# Patient Record
Sex: Male | Born: 1970 | Race: White | Hispanic: No | Marital: Married | Smoking: Never smoker
Health system: Southern US, Community
[De-identification: ages and names within clinical notes are randomized; demographics above are authoritative.]

## PROBLEM LIST (undated history)

## (undated) DIAGNOSIS — I10 Essential (primary) hypertension: Secondary | ICD-10-CM

## (undated) DIAGNOSIS — F329 Major depressive disorder, single episode, unspecified: Secondary | ICD-10-CM

## (undated) DIAGNOSIS — F32A Depression, unspecified: Secondary | ICD-10-CM

## (undated) DIAGNOSIS — F419 Anxiety disorder, unspecified: Secondary | ICD-10-CM

## (undated) HISTORY — PX: TONSILLECTOMY: SUR1361

## (undated) HISTORY — PX: EYE SURGERY: SHX253

---

## 2004-08-25 ENCOUNTER — Ambulatory Visit (HOSPITAL_COMMUNITY): Admission: RE | Admit: 2004-08-25 | Discharge: 2004-08-25 | Payer: Self-pay | Admitting: Family Medicine

## 2012-11-01 ENCOUNTER — Emergency Department (HOSPITAL_COMMUNITY)
Admission: EM | Admit: 2012-11-01 | Discharge: 2012-11-01 | Disposition: A | Discharge status: Home / Self Care | Payer: Managed Care, Other (non HMO) | Source: Home / Self Care | Attending: Family Medicine | Admitting: Family Medicine

## 2012-11-01 ENCOUNTER — Encounter (HOSPITAL_COMMUNITY): Payer: Self-pay | Admitting: Emergency Medicine

## 2012-11-01 DIAGNOSIS — J069 Acute upper respiratory infection, unspecified: Secondary | ICD-10-CM

## 2012-11-01 HISTORY — DX: Major depressive disorder, single episode, unspecified: F32.9

## 2012-11-01 HISTORY — DX: Essential (primary) hypertension: I10

## 2012-11-01 HISTORY — DX: Depression, unspecified: F32.A

## 2012-11-01 HISTORY — DX: Anxiety disorder, unspecified: F41.9

## 2012-11-01 MED ORDER — IPRATROPIUM BROMIDE 0.06 % NA SOLN: 2.0000 | Freq: Four times a day (QID) | NASAL | Status: AC

## 2012-11-01 MED ORDER — AZITHROMYCIN 250 MG PO TABS: ORAL_TABLET | ORAL | Status: AC

## 2012-11-01 NOTE — ED Notes (Signed)
Pt c/o sinus congestion and pressure. Dry nonproductive cough. Low grade temp.   Pt states unable to sleep at night due cough.   Denies sob, n/v/d. Pt has taken otc meds with no relief of symptoms.

## 2012-11-01 NOTE — ED Notes (Signed)
Waiting discharge papers 

## 2012-11-01 NOTE — ED Provider Notes (Signed)
History     CSN: [id number]  Arrival date & time 11/01/12  1136   First MD Initiated Contact with Patient 11/01/12 1138      Chief Complaint  Patient presents with  . Facial Pain    sinus congestion and pressure. dry cough. low grade temp. cant sleep at night due to cough.     (Consider location/radiation/quality/duration/timing/severity/associated sxs/prior treatment) Patient is a 42 y.o. male presenting with URI. The history is provided by the patient.  URI The primary symptoms include cough. Primary symptoms do not include fever, headaches, sore throat, wheezing or myalgias. The current episode started more than 1 week ago. This is a new problem. The problem has not changed since onset. Symptoms associated with the illness include sinus pressure, congestion and rhinorrhea. The illness is not associated with chills.    Past Medical History  Diagnosis Date  . Hypertension   . Anxiety   . Depression     Past Surgical History  Procedure Date  . Tonsillectomy   . Eye surgery     History reviewed. No pertinent family history.  History  Substance Use Topics  . Smoking status: Never Smoker   . Smokeless tobacco: Not on file  . Alcohol Use: Yes     Comment: occasional      Review of Systems  Constitutional: Negative.  Negative for fever and chills.  HENT: Positive for congestion, rhinorrhea, postnasal drip and sinus pressure. Negative for sore throat.   Respiratory: Positive for cough. Negative for wheezing.   Musculoskeletal: Negative for myalgias.  Neurological: Negative for headaches.    Allergies  Codeine  Home Medications   Current Outpatient Rx  Name  Route  Sig  Dispense  Refill  . AZITHROMYCIN 250 MG PO TABS      Take as directed on pack   6 each   0   . IPRATROPIUM BROMIDE 0.06 % NA SOLN   Nasal   Place 2 sprays into the nose 4 (four) times daily.   15 mL   1     BP 120/91  Pulse 86  Temp 98.9 F (37.2 C) (Oral)  Resp 18  SpO2  100%  Physical Exam  Nursing note and vitals reviewed. Constitutional: He is oriented to person, place, and time. He appears well-developed and well-nourished.  HENT:  Head: Normocephalic.  Right Ear: External ear normal.  Left Ear: External ear normal.  Nose: Mucosal edema and rhinorrhea present. No epistaxis. Right sinus exhibits maxillary sinus tenderness and frontal sinus tenderness. Left sinus exhibits maxillary sinus tenderness and frontal sinus tenderness.  Mouth/Throat: Oropharynx is clear and moist.  Cardiovascular: Regular rhythm and normal heart sounds.   Pulmonary/Chest: Effort normal and breath sounds normal.  Neurological: He is alert and oriented to person, place, and time.  Skin: Skin is warm and dry.    ED Course  Procedures (including critical care time)  Labs Reviewed - No data to display No results found.   1. URI (upper respiratory infection)       MDM          Linna Hoff, MD 11/01/12 1223

## 2014-05-26 ENCOUNTER — Encounter (HOSPITAL_COMMUNITY): Payer: Self-pay | Admitting: Emergency Medicine

## 2014-05-26 ENCOUNTER — Emergency Department (HOSPITAL_COMMUNITY)
Admission: EM | Admit: 2014-05-26 | Discharge: 2014-05-26 | Disposition: A | Discharge status: Home / Self Care | Payer: BC Managed Care – PPO | Attending: Emergency Medicine | Admitting: Emergency Medicine

## 2014-05-26 ENCOUNTER — Emergency Department (HOSPITAL_COMMUNITY): Payer: BC Managed Care – PPO

## 2014-05-26 DIAGNOSIS — I10 Essential (primary) hypertension: Secondary | ICD-10-CM | POA: Diagnosis not present

## 2014-05-26 DIAGNOSIS — IMO0002 Reserved for concepts with insufficient information to code with codable children: Secondary | ICD-10-CM | POA: Diagnosis not present

## 2014-05-26 DIAGNOSIS — M549 Dorsalgia, unspecified: Secondary | ICD-10-CM | POA: Diagnosis not present

## 2014-05-26 DIAGNOSIS — S20219A Contusion of unspecified front wall of thorax, initial encounter: Secondary | ICD-10-CM | POA: Diagnosis not present

## 2014-05-26 DIAGNOSIS — Y9389 Activity, other specified: Secondary | ICD-10-CM | POA: Diagnosis not present

## 2014-05-26 DIAGNOSIS — S298XXA Other specified injuries of thorax, initial encounter: Secondary | ICD-10-CM | POA: Diagnosis present

## 2014-05-26 DIAGNOSIS — Z79899 Other long term (current) drug therapy: Secondary | ICD-10-CM | POA: Insufficient documentation

## 2014-05-26 DIAGNOSIS — Z8659 Personal history of other mental and behavioral disorders: Secondary | ICD-10-CM | POA: Diagnosis not present

## 2014-05-26 DIAGNOSIS — Y9289 Other specified places as the place of occurrence of the external cause: Secondary | ICD-10-CM | POA: Insufficient documentation

## 2014-05-26 DIAGNOSIS — S20212A Contusion of left front wall of thorax, initial encounter: Secondary | ICD-10-CM

## 2014-05-26 MED ORDER — IBUPROFEN 400 MG PO TABS
600.0000 mg | ORAL_TABLET | Freq: Once | ORAL | Status: AC
  Administered 2014-05-26: 600 mg via ORAL
  Filled 2014-05-26: qty 2

## 2014-05-26 MED ORDER — IBUPROFEN 600 MG PO TABS: 600.0000 mg | ORAL_TABLET | Freq: Four times a day (QID) | ORAL | Status: AC | PRN

## 2014-05-26 NOTE — ED Notes (Signed)
Left Rib pain since last Thursday after hearing a pop. States he just wants to make sure everything is okay. Pt states sometimes hurts to take a deep breath but that has gotten better.

## 2014-05-26 NOTE — Discharge Instructions (Signed)

## 2014-05-26 NOTE — ED Provider Notes (Signed)
CSN: [id number]     Arrival date & time 05/26/14  0813 History   First MD Initiated Contact with Patient 05/26/14 [phone]     Chief Complaint  Patient presents with  . Chest Pain     (Consider location/radiation/quality/duration/timing/severity/associated sxs/prior Treatment) HPI  This is a 43 year old male with a history of hypertension, anxiety, and depression who presents with left rib pain. Patient states that on Thursday last week he was trying to unclog a grease gun. He states that it recoiled and hit him in the left chest. Since that time he has had sharp chest pains that come and go. It is worse with some movements and breathing. He sometimes feels a "pop." He has not taken anything for pain. Currently his pain is 4/10. He states that sometimes he can also fill in his back. He denies any recent illnesses, cough, fevers.  Past Medical History  Diagnosis Date  . Hypertension   . Anxiety   . Depression    Past Surgical History  Procedure Laterality Date  . Tonsillectomy    . Eye surgery     No family history on file. History  Substance Use Topics  . Smoking status: Never Smoker   . Smokeless tobacco: Not on file  . Alcohol Use: Yes     Comment: occasional    Review of Systems  Constitutional: Negative.  Negative for fever.  Respiratory: Negative.  Negative for cough, chest tightness and shortness of breath.   Cardiovascular: Positive for chest pain.  Gastrointestinal: Negative.   Genitourinary: Negative.  Negative for dysuria.  Musculoskeletal: Positive for back pain.  Skin: Negative for rash.  Neurological: Negative for headaches.  All other systems reviewed and are negative.     Allergies  Benadryl and Codeine  Home Medications   Prior to Admission medications   Medication Sig Start Date End Date Taking? Authorizing Provider  azithromycin (ZITHROMAX Z-PAK) 250 MG tablet Take as directed on pack 11/01/12   Linna Hoff, MD  ipratropium (ATROVENT) 0.06 %  nasal spray Place 2 sprays into the nose 4 (four) times daily. 11/01/12   Linna Hoff, MD   BP 126/92  Pulse 80  Temp(Src) 98.5 F (36.9 C) (Oral)  Resp 16  Ht 5\' 10"  (1.778 m)  Wt 192 lb (87.091 kg)  BMI 27.55 kg/m2  SpO2 100% Physical Exam  Nursing note and vitals reviewed. Constitutional: He is oriented to person, place, and time. He appears well-developed and well-nourished. No distress.  HENT:  Head: Normocephalic and atraumatic.  Cardiovascular: Normal rate, regular rhythm and normal heart sounds.   No murmur heard. Pulmonary/Chest: Effort normal and breath sounds normal. No respiratory distress. He has no wheezes. He exhibits tenderness.  Tenderness palpation of the anterior left chest wall without crepitus  Abdominal: Soft. Bowel sounds are normal. There is no tenderness. There is no rebound.  Musculoskeletal: He exhibits no edema.  Neurological: He is alert and oriented to person, place, and time.  Skin: Skin is warm and dry.  Psychiatric: He has a normal mood and affect.    ED Course  Procedures (including critical care time) Labs Review Labs Reviewed - No data to display  Imaging Review Dg Ribs Unilateral W/chest Left  05/26/2014   CLINICAL DATA:  Left chest pain.  EXAM: LEFT RIBS AND CHEST - 3+ VIEW  COMPARISON:  None.  FINDINGS: No fracture or other bone lesions are seen involving the ribs. There is no evidence of pneumothorax or pleural effusion. Both  lungs are clear. Heart size and mediastinal contours are within normal limits.  IMPRESSION: Negative.   Electronically Signed   By: Marlan Palauharles  Clark M.D.   On: 05/26/2014 08:51     EKG Interpretation None      MDM   Final diagnoses:  Chest wall contusion, left, initial encounter    Patient presents with left-sided rib pain. He is nontoxic on exam. Sustained injury approximately 6 days ago. He has tenderness to palpation that is reproducible. He is in no acute distress.  Patient was given ibuprofen. Chest and  rib films show no evidence of rib fracture. Patient states that he does not like to take pain medication. Will discharge with ibuprofen. Discussed with patient continue deep breathing to prevent pneumonia.  After history, exam, and medical workup I feel the patient has been appropriately medically screened and is safe for discharge home. Pertinent diagnoses were discussed with the patient. Patient was given return precautions.     Shon Batonourtney F Saron Tweed, MD 05/26/14 [phone]

## 2014-12-20 IMAGING — CR DG RIBS W/ CHEST 3+V*L*
4 series · 4 of 4 positions shown · non-contrast
Comparison: None.

CLINICAL DATA: Left chest pain.

EXAM:
LEFT RIBS AND CHEST - 3+ VIEW

[view not recorded (1 of 4)]
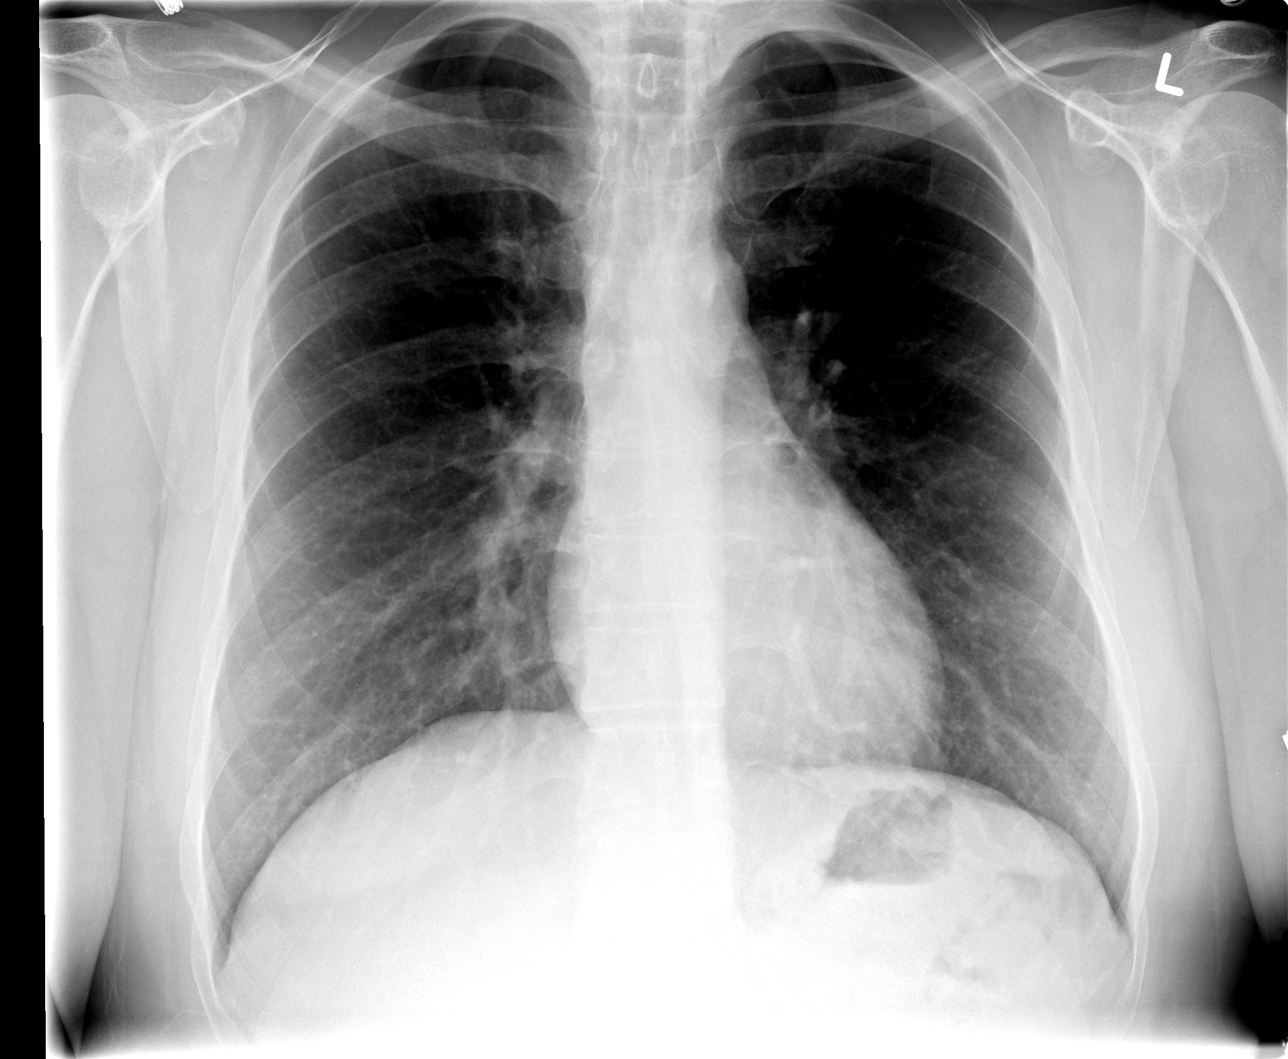

[view not recorded (2 of 4)]
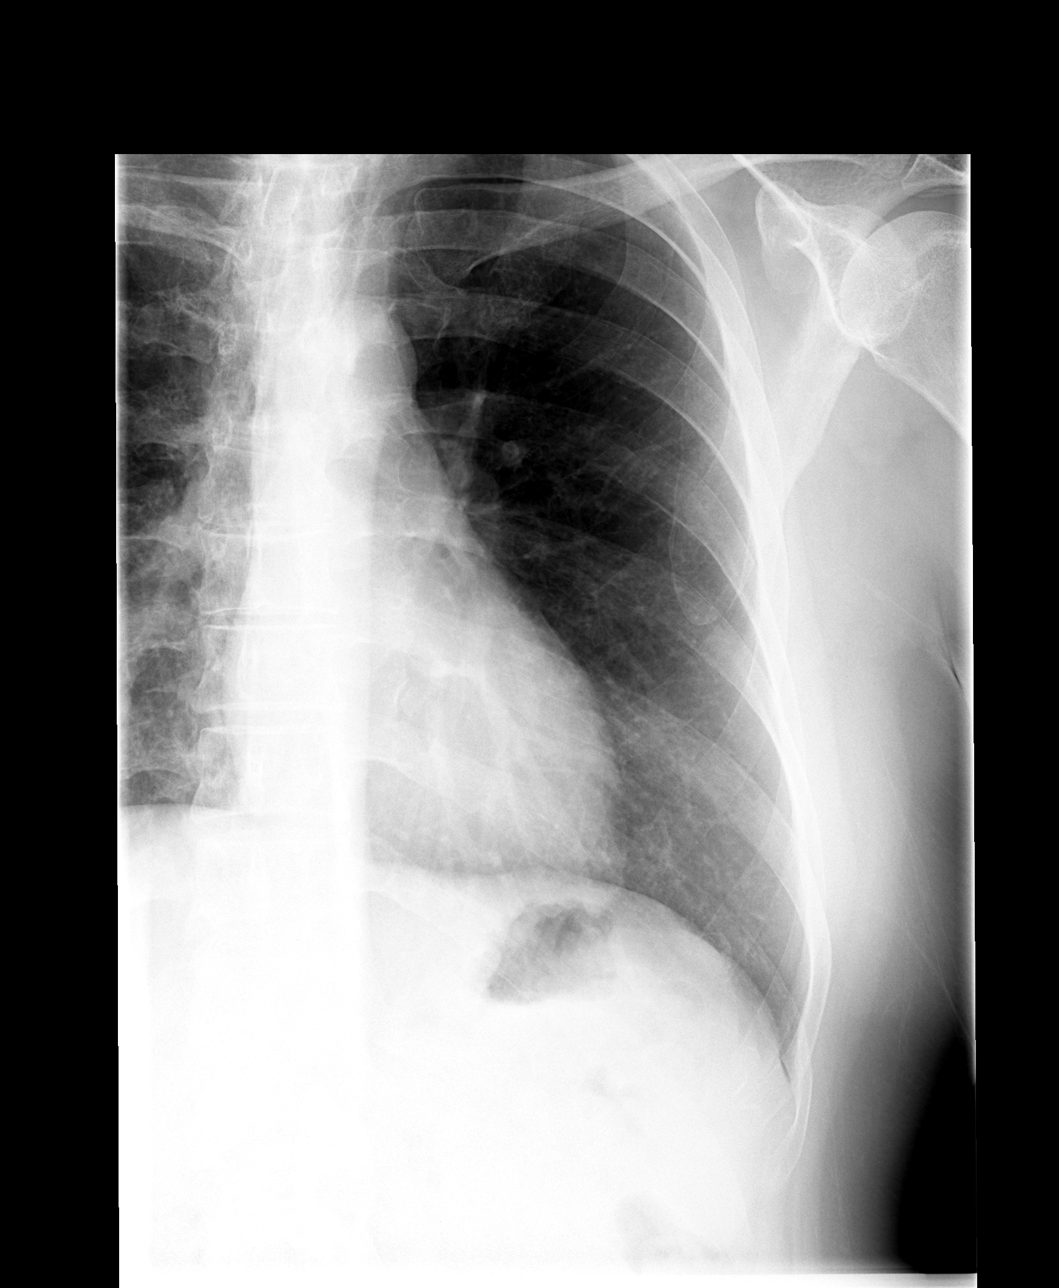

[view not recorded (3 of 4)]
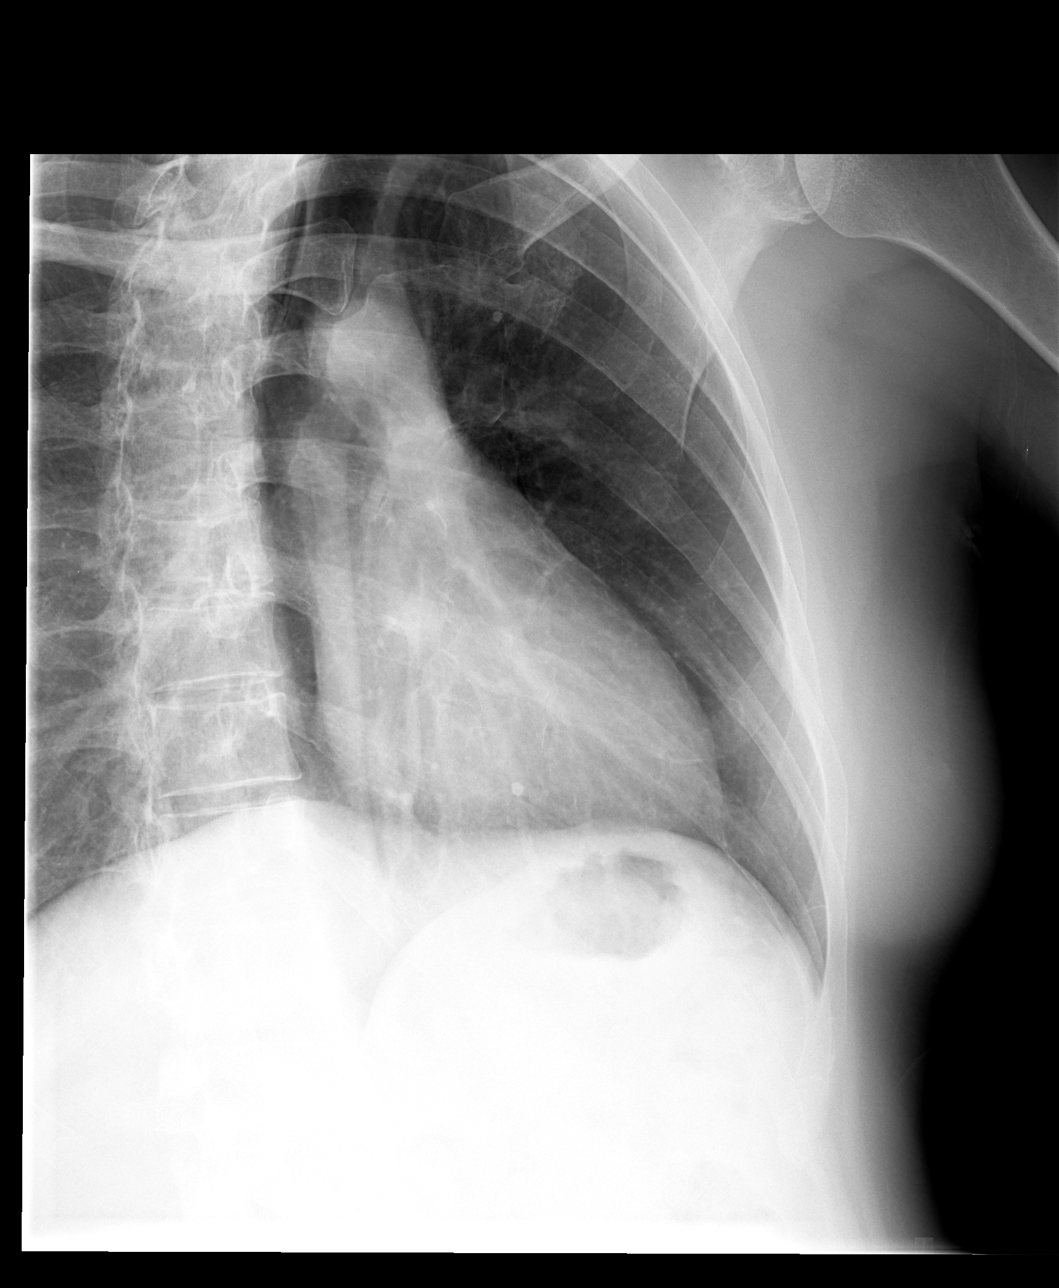

[view not recorded (4 of 4)]
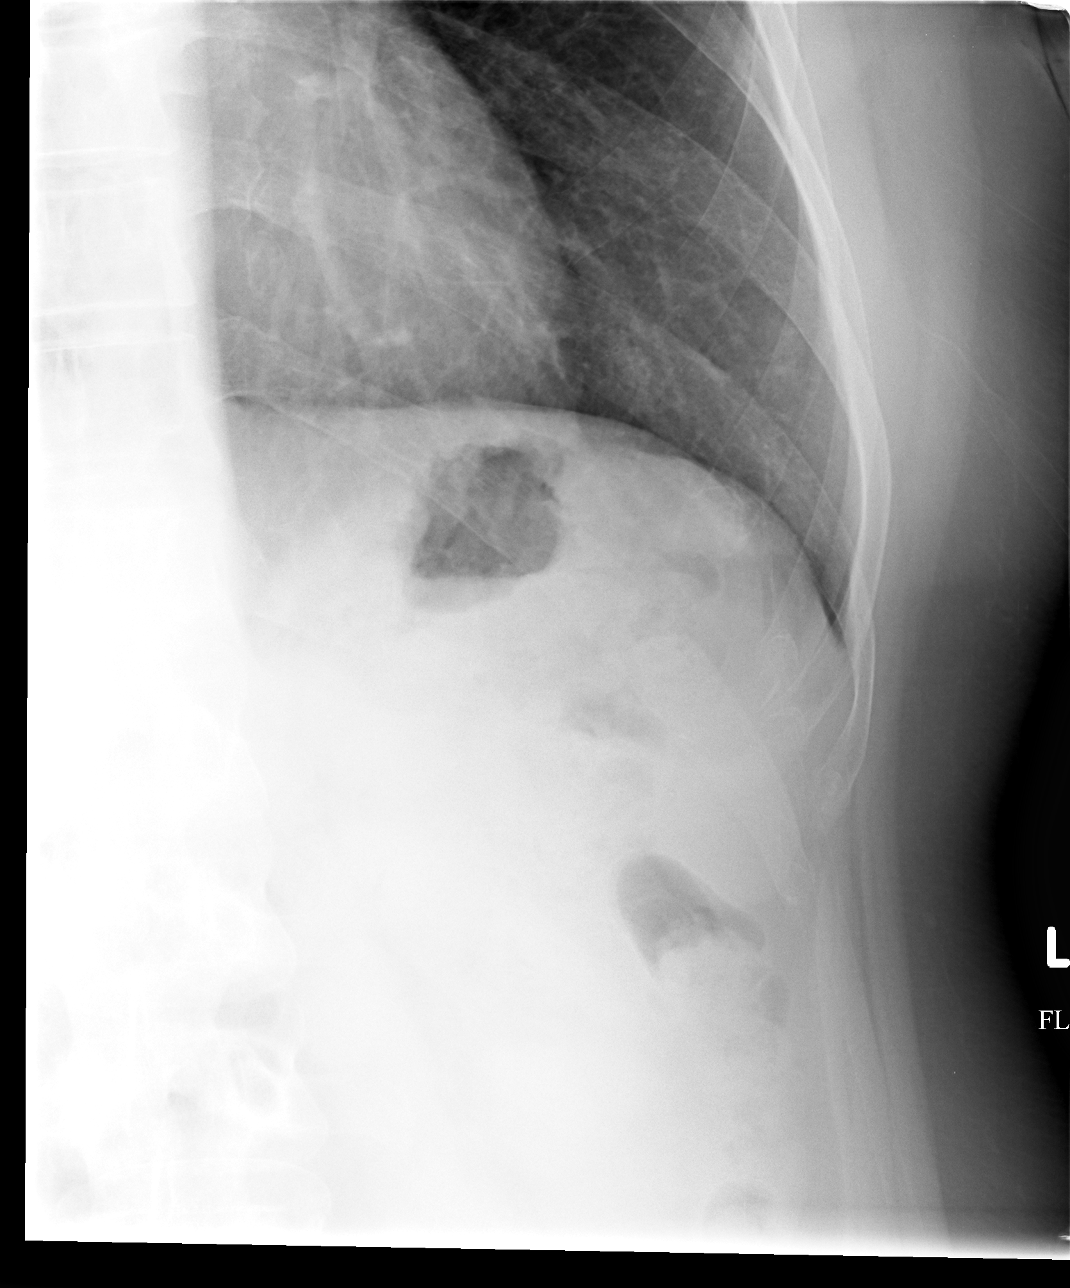

[4 of 4 positions shown; findings below may reference images not displayed]

FINDINGS: No fracture or other bone lesions are seen involving the ribs. There
is no evidence of pneumothorax or pleural effusion. Both lungs are
clear. Heart size and mediastinal contours are within normal limits.
IMPRESSION: Negative.

## 2016-08-07 DIAGNOSIS — Z0000 Encounter for general adult medical examination without abnormal findings: Secondary | ICD-10-CM | POA: Diagnosis not present

## 2016-08-07 DIAGNOSIS — E782 Mixed hyperlipidemia: Secondary | ICD-10-CM | POA: Diagnosis not present

## 2017-04-19 DIAGNOSIS — J40 Bronchitis, not specified as acute or chronic: Secondary | ICD-10-CM | POA: Diagnosis not present

## 2017-04-19 DIAGNOSIS — J329 Chronic sinusitis, unspecified: Secondary | ICD-10-CM | POA: Diagnosis not present

## 2017-04-19 DIAGNOSIS — J069 Acute upper respiratory infection, unspecified: Secondary | ICD-10-CM | POA: Diagnosis not present

## 2017-08-16 DIAGNOSIS — Z23 Encounter for immunization: Secondary | ICD-10-CM | POA: Diagnosis not present

## 2017-08-16 DIAGNOSIS — I10 Essential (primary) hypertension: Secondary | ICD-10-CM | POA: Diagnosis not present

## 2017-08-16 DIAGNOSIS — E782 Mixed hyperlipidemia: Secondary | ICD-10-CM | POA: Diagnosis not present

## 2017-08-16 DIAGNOSIS — Z125 Encounter for screening for malignant neoplasm of prostate: Secondary | ICD-10-CM | POA: Diagnosis not present

## 2017-08-16 DIAGNOSIS — Z0000 Encounter for general adult medical examination without abnormal findings: Secondary | ICD-10-CM | POA: Diagnosis not present

## 2018-09-10 DIAGNOSIS — Z0000 Encounter for general adult medical examination without abnormal findings: Secondary | ICD-10-CM | POA: Diagnosis not present

## 2018-09-10 DIAGNOSIS — E782 Mixed hyperlipidemia: Secondary | ICD-10-CM | POA: Diagnosis not present

## 2018-09-10 DIAGNOSIS — Z125 Encounter for screening for malignant neoplasm of prostate: Secondary | ICD-10-CM | POA: Diagnosis not present

## 2018-09-10 DIAGNOSIS — I10 Essential (primary) hypertension: Secondary | ICD-10-CM | POA: Diagnosis not present

## 2018-12-22 DIAGNOSIS — H10013 Acute follicular conjunctivitis, bilateral: Secondary | ICD-10-CM | POA: Diagnosis not present

## 2019-07-16 DIAGNOSIS — J019 Acute sinusitis, unspecified: Secondary | ICD-10-CM | POA: Diagnosis not present

## 2019-10-14 DIAGNOSIS — Z0000 Encounter for general adult medical examination without abnormal findings: Secondary | ICD-10-CM | POA: Diagnosis not present

## 2019-10-29 DIAGNOSIS — I10 Essential (primary) hypertension: Secondary | ICD-10-CM | POA: Diagnosis not present

## 2019-10-29 DIAGNOSIS — E782 Mixed hyperlipidemia: Secondary | ICD-10-CM | POA: Diagnosis not present

## 2019-10-29 DIAGNOSIS — Z125 Encounter for screening for malignant neoplasm of prostate: Secondary | ICD-10-CM | POA: Diagnosis not present

## 2019-11-05 DIAGNOSIS — Z1211 Encounter for screening for malignant neoplasm of colon: Secondary | ICD-10-CM | POA: Diagnosis not present

## 2020-05-07 DIAGNOSIS — Z20828 Contact with and (suspected) exposure to other viral communicable diseases: Secondary | ICD-10-CM | POA: Diagnosis not present

## 2020-05-19 DIAGNOSIS — Z20828 Contact with and (suspected) exposure to other viral communicable diseases: Secondary | ICD-10-CM | POA: Diagnosis not present

## 2020-06-10 DIAGNOSIS — Z20828 Contact with and (suspected) exposure to other viral communicable diseases: Secondary | ICD-10-CM | POA: Diagnosis not present

## 2020-06-30 DIAGNOSIS — Z20828 Contact with and (suspected) exposure to other viral communicable diseases: Secondary | ICD-10-CM | POA: Diagnosis not present

## 2020-07-02 DIAGNOSIS — H04123 Dry eye syndrome of bilateral lacrimal glands: Secondary | ICD-10-CM | POA: Diagnosis not present

## 2020-11-03 DIAGNOSIS — Z20822 Contact with and (suspected) exposure to covid-19: Secondary | ICD-10-CM | POA: Diagnosis not present

## 2020-11-21 DIAGNOSIS — I10 Essential (primary) hypertension: Secondary | ICD-10-CM | POA: Diagnosis not present

## 2020-11-21 DIAGNOSIS — E782 Mixed hyperlipidemia: Secondary | ICD-10-CM | POA: Diagnosis not present

## 2020-11-21 DIAGNOSIS — R5382 Chronic fatigue, unspecified: Secondary | ICD-10-CM | POA: Diagnosis not present

## 2020-11-21 DIAGNOSIS — Z0000 Encounter for general adult medical examination without abnormal findings: Secondary | ICD-10-CM | POA: Diagnosis not present

## 2020-11-21 DIAGNOSIS — Z125 Encounter for screening for malignant neoplasm of prostate: Secondary | ICD-10-CM | POA: Diagnosis not present

## 2020-11-28 DIAGNOSIS — L218 Other seborrheic dermatitis: Secondary | ICD-10-CM | POA: Diagnosis not present

## 2020-11-28 DIAGNOSIS — C44319 Basal cell carcinoma of skin of other parts of face: Secondary | ICD-10-CM | POA: Diagnosis not present

## 2020-12-28 DIAGNOSIS — Z08 Encounter for follow-up examination after completed treatment for malignant neoplasm: Secondary | ICD-10-CM | POA: Diagnosis not present

## 2020-12-28 DIAGNOSIS — I781 Nevus, non-neoplastic: Secondary | ICD-10-CM | POA: Diagnosis not present

## 2020-12-28 DIAGNOSIS — L218 Other seborrheic dermatitis: Secondary | ICD-10-CM | POA: Diagnosis not present

## 2020-12-28 DIAGNOSIS — Z85828 Personal history of other malignant neoplasm of skin: Secondary | ICD-10-CM | POA: Diagnosis not present

## 2022-02-20 DIAGNOSIS — Z0000 Encounter for general adult medical examination without abnormal findings: Secondary | ICD-10-CM | POA: Diagnosis not present

## 2022-02-20 DIAGNOSIS — J302 Other seasonal allergic rhinitis: Secondary | ICD-10-CM | POA: Diagnosis not present

## 2022-02-20 DIAGNOSIS — I10 Essential (primary) hypertension: Secondary | ICD-10-CM | POA: Diagnosis not present

## 2022-02-20 DIAGNOSIS — F419 Anxiety disorder, unspecified: Secondary | ICD-10-CM | POA: Diagnosis not present

## 2022-02-20 DIAGNOSIS — Z23 Encounter for immunization: Secondary | ICD-10-CM | POA: Diagnosis not present

## 2022-02-20 DIAGNOSIS — Z125 Encounter for screening for malignant neoplasm of prostate: Secondary | ICD-10-CM | POA: Diagnosis not present

## 2022-02-20 DIAGNOSIS — E782 Mixed hyperlipidemia: Secondary | ICD-10-CM | POA: Diagnosis not present

## 2022-02-20 DIAGNOSIS — Z1159 Encounter for screening for other viral diseases: Secondary | ICD-10-CM | POA: Diagnosis not present

## 2022-02-22 DIAGNOSIS — Z1211 Encounter for screening for malignant neoplasm of colon: Secondary | ICD-10-CM | POA: Diagnosis not present

## 2022-07-09 DIAGNOSIS — D1801 Hemangioma of skin and subcutaneous tissue: Secondary | ICD-10-CM | POA: Diagnosis not present

## 2022-07-09 DIAGNOSIS — L820 Inflamed seborrheic keratosis: Secondary | ICD-10-CM | POA: Diagnosis not present

## 2022-08-09 DIAGNOSIS — R030 Elevated blood-pressure reading, without diagnosis of hypertension: Secondary | ICD-10-CM | POA: Diagnosis not present

## 2022-08-09 DIAGNOSIS — J020 Streptococcal pharyngitis: Secondary | ICD-10-CM | POA: Diagnosis not present

## 2022-08-09 DIAGNOSIS — Z6831 Body mass index (BMI) 31.0-31.9, adult: Secondary | ICD-10-CM | POA: Diagnosis not present

## 2022-08-09 DIAGNOSIS — H9201 Otalgia, right ear: Secondary | ICD-10-CM | POA: Diagnosis not present

## 2022-11-02 DIAGNOSIS — I10 Essential (primary) hypertension: Secondary | ICD-10-CM | POA: Diagnosis not present

## 2022-11-02 DIAGNOSIS — F419 Anxiety disorder, unspecified: Secondary | ICD-10-CM | POA: Diagnosis not present

## 2022-11-02 DIAGNOSIS — E782 Mixed hyperlipidemia: Secondary | ICD-10-CM | POA: Diagnosis not present

## 2023-02-02 DIAGNOSIS — H10013 Acute follicular conjunctivitis, bilateral: Secondary | ICD-10-CM | POA: Diagnosis not present

## 2023-06-21 DIAGNOSIS — Z125 Encounter for screening for malignant neoplasm of prostate: Secondary | ICD-10-CM | POA: Diagnosis not present

## 2023-06-21 DIAGNOSIS — F419 Anxiety disorder, unspecified: Secondary | ICD-10-CM | POA: Diagnosis not present

## 2023-06-21 DIAGNOSIS — Z0000 Encounter for general adult medical examination without abnormal findings: Secondary | ICD-10-CM | POA: Diagnosis not present

## 2023-06-21 DIAGNOSIS — J302 Other seasonal allergic rhinitis: Secondary | ICD-10-CM | POA: Diagnosis not present

## 2023-06-21 DIAGNOSIS — I10 Essential (primary) hypertension: Secondary | ICD-10-CM | POA: Diagnosis not present

## 2023-06-21 DIAGNOSIS — E782 Mixed hyperlipidemia: Secondary | ICD-10-CM | POA: Diagnosis not present

## 2023-06-25 DIAGNOSIS — Z1211 Encounter for screening for malignant neoplasm of colon: Secondary | ICD-10-CM | POA: Diagnosis not present

## 2023-09-09 DIAGNOSIS — R070 Pain in throat: Secondary | ICD-10-CM | POA: Diagnosis not present

## 2023-09-09 DIAGNOSIS — J019 Acute sinusitis, unspecified: Secondary | ICD-10-CM | POA: Diagnosis not present

## 2023-09-09 DIAGNOSIS — R519 Headache, unspecified: Secondary | ICD-10-CM | POA: Diagnosis not present

## 2023-09-09 DIAGNOSIS — R0981 Nasal congestion: Secondary | ICD-10-CM | POA: Diagnosis not present

## 2024-03-01 DIAGNOSIS — R030 Elevated blood-pressure reading, without diagnosis of hypertension: Secondary | ICD-10-CM | POA: Diagnosis not present

## 2024-03-01 DIAGNOSIS — Z6833 Body mass index (BMI) 33.0-33.9, adult: Secondary | ICD-10-CM | POA: Diagnosis not present

## 2024-03-01 DIAGNOSIS — E669 Obesity, unspecified: Secondary | ICD-10-CM | POA: Diagnosis not present

## 2024-03-01 DIAGNOSIS — J069 Acute upper respiratory infection, unspecified: Secondary | ICD-10-CM | POA: Diagnosis not present

## 2024-07-02 NOTE — Progress Notes (Signed)
 Chris Schultz                                          MRN: [id number]   07/02/2024   The [facility] Quality Team Specialist reviewed this patient medical record for the purposes of chart review for care gap closure. The following were reviewed: chart review for care gap closure-controlling blood pressure.    [facility] Quality Team
